# Patient Record
Sex: Male | Born: 1970 | Hispanic: No
Health system: Southern US, Community
[De-identification: ages and names within clinical notes are randomized; demographics above are authoritative.]

## PROBLEM LIST (undated history)

## (undated) HISTORY — PX: KNEE SURGERY: SHX244

---

## 2017-11-27 ENCOUNTER — Ambulatory Visit (INDEPENDENT_AMBULATORY_CARE_PROVIDER_SITE_OTHER): Payer: BLUE CROSS/BLUE SHIELD

## 2017-11-27 ENCOUNTER — Other Ambulatory Visit: Payer: Self-pay

## 2017-11-27 ENCOUNTER — Ambulatory Visit
Admission: EM | Admit: 2017-11-27 | Discharge: 2017-11-27 | Disposition: A | Discharge status: Home / Self Care | Payer: BLUE CROSS/BLUE SHIELD | Attending: Family Medicine | Admitting: Family Medicine

## 2017-11-27 DIAGNOSIS — W108XXA Fall (on) (from) other stairs and steps, initial encounter: Secondary | ICD-10-CM

## 2017-11-27 DIAGNOSIS — S6992XA Unspecified injury of left wrist, hand and finger(s), initial encounter: Secondary | ICD-10-CM | POA: Diagnosis not present

## 2017-11-27 DIAGNOSIS — R2232 Localized swelling, mass and lump, left upper limb: Secondary | ICD-10-CM

## 2017-11-27 DIAGNOSIS — M79645 Pain in left finger(s): Secondary | ICD-10-CM | POA: Diagnosis not present

## 2017-11-27 NOTE — Discharge Instructions (Signed)
Rest, ice.  Ibuprofen as needed.  Take care  Dr. Arbutus Nelligan  

## 2017-11-27 NOTE — ED Triage Notes (Signed)
Pt reports tripping and landing of his hand on Saturday, bending his left thumb back. Thumb is swollen. Pain is minimal. Reports "I'm getting movement back."

## 2017-11-27 NOTE — ED Provider Notes (Signed)
[facility]    CSN: [id number] Arrival date & time: 11/27/17  1029  History   Chief Complaint Chief Complaint  Patient presents with  . Hand Injury   HPI   47 year old male presents with a thumb injury.  Patient states that on Saturday he fell while going up the steps.  When he landed he hyperextended his left thumb.  Patient reports bruising, swelling, and pain at the MCP joint.  No reports of wrist pain.  Worse with activity.  No relieving factors.  He states that the pain is minimal.  He states that he feels like it is improving.  No other reported symptoms.  No other complaints or concerns at this time.  Social History Social History   Tobacco Use  . Smoking status: Former Games developermoker  . Smokeless tobacco: Current User    Types: Snuff  Substance Use Topics  . Alcohol use: Yes    Comment: 4-5 beers daily  . Drug use: Not Currently   Allergies   Patient has no known allergies.  Review of Systems Review of Systems  Constitutional: Negative.   Musculoskeletal:       Thumb injury, swelling, bruising.   Physical Exam Triage Vital Signs ED Triage Vitals  Enc Vitals Group     BP 11/27/17 1043 (!) 184/108     Pulse Rate 11/27/17 1043 71     Resp 11/27/17 1043 18     Temp 11/27/17 1043 98.2 F (36.8 C)     Temp Source 11/27/17 1043 Oral     SpO2 11/27/17 1043 100 %     Weight 11/27/17 1042 186 lb (84.4 kg)     Height 11/27/17 1042 5\' 8"  (1.727 m)     Head Circumference --      Peak Flow --      Pain Score 11/27/17 1041 1     Pain Loc --      Pain Edu? --      Excl. in GC? --    Updated Vital Signs BP (!) 184/108 (BP Location: Left Arm)   Pulse 71   Temp 98.2 F (36.8 C) (Oral)   Resp 18   Ht 5\' 8"  (1.727 m)   Wt 186 lb (84.4 kg)   SpO2 100%   BMI 28.28 kg/m   Physical Exam  Constitutional: He is oriented to person, place, and time. He appears well-developed. No distress.  Cardiovascular: Normal rate and regular rhythm.  Pulmonary/Chest:  Effort normal and breath sounds normal. He has no wheezes. He has no rales.  Musculoskeletal:  Left thumb -mild tenderness at the MCP joint.  Edema and bruising noted.  Neurological: He is alert and oriented to person, place, and time.  Psychiatric: He has a normal mood and affect. His behavior is normal.  Nursing note and vitals reviewed.  UC Treatments / Results  Labs (all labs ordered are listed, but only abnormal results are displayed) Labs Reviewed - No data to display  EKG None  Radiology Dg Finger Thumb Left  Result Date: 11/27/2017 CLINICAL DATA:  Patient reports tripping 3 days ago and hyperextending the thumb during the fall. The patient reports pain and swelling over the first MCP joint. EXAM: LEFT THUMB 2+V COMPARISON:  None in PACs FINDINGS: The bones are subjectively adequately mineralized. There is no acute fracture. The joint spaces are well maintained. There is mild soft tissue swelling over the dorsum of the first MCP joint. IMPRESSION: No acute fracture nor dislocation of the bones  of the left thumb. Electronically Signed   By: David  [place] M.D.   On: 11/27/2017 11:13    Procedures Procedures (including critical care time)  Medications Ordered in UC Medications - No data to display  Initial Impression / Assessment and Plan / UC Course  I have reviewed the triage vital signs and the nursing notes.  Pertinent labs & imaging results that were available during my care of the patient were reviewed by me and considered in my medical decision making (see chart for details).    47 year old male presents with a thumb injury.  X-ray negative.  Rest, ice, ibuprofen.  Supportive care.  Final Clinical Impressions(s) / UC Diagnoses   Final diagnoses:  Injury of left thumb, initial encounter     Discharge Instructions     Rest, ice.  Ibuprofen as needed.  Take care  Dr. Adriana Simas     ED Prescriptions    None     Controlled Substance Prescriptions [place]  Controlled Substance Registry consulted? Not Applicable   Tommie Sams, DO 11/27/17 1140

## 2019-02-28 ENCOUNTER — Ambulatory Visit: Payer: Self-pay

## 2019-03-04 ENCOUNTER — Other Ambulatory Visit: Payer: Self-pay

## 2019-03-04 ENCOUNTER — Ambulatory Visit: Payer: Self-pay

## 2019-03-04 VITALS — BP 142/88 | HR 78 | Resp 15 | Ht 68.0 in | Wt 186.0 lb

## 2019-03-04 DIAGNOSIS — Z008 Encounter for other general examination: Secondary | ICD-10-CM

## 2019-03-04 LAB — POCT LIPID PANEL
HDL: 92
LDL: 82
Non-HDL: 94
POC Glucose: 101 mg/dl — AB (ref 70–99)
TC/HDL: 2
TC: 185
TRG: 61

## 2019-03-04 NOTE — Progress Notes (Signed)
     Patient ID: Stephen Parker, male    DOB: 12/27/1970, 48 y.o.   MRN: [id number]    Thank you!!  [place] [facility] [place]: [phone]  Cell:  [phone] Website: [email or web address]

## 2019-06-05 IMAGING — CR DG FINGER THUMB 2+V*L*
3 series · 3 of 3 positions shown · non-contrast
Comparison: None in PACs

CLINICAL DATA: Patient reports tripping 3 days ago and
hyperextending the thumb during the fall. The patient reports pain
and swelling over the first MCP joint.

EXAM:
LEFT THUMB 2+V

[finger ap]
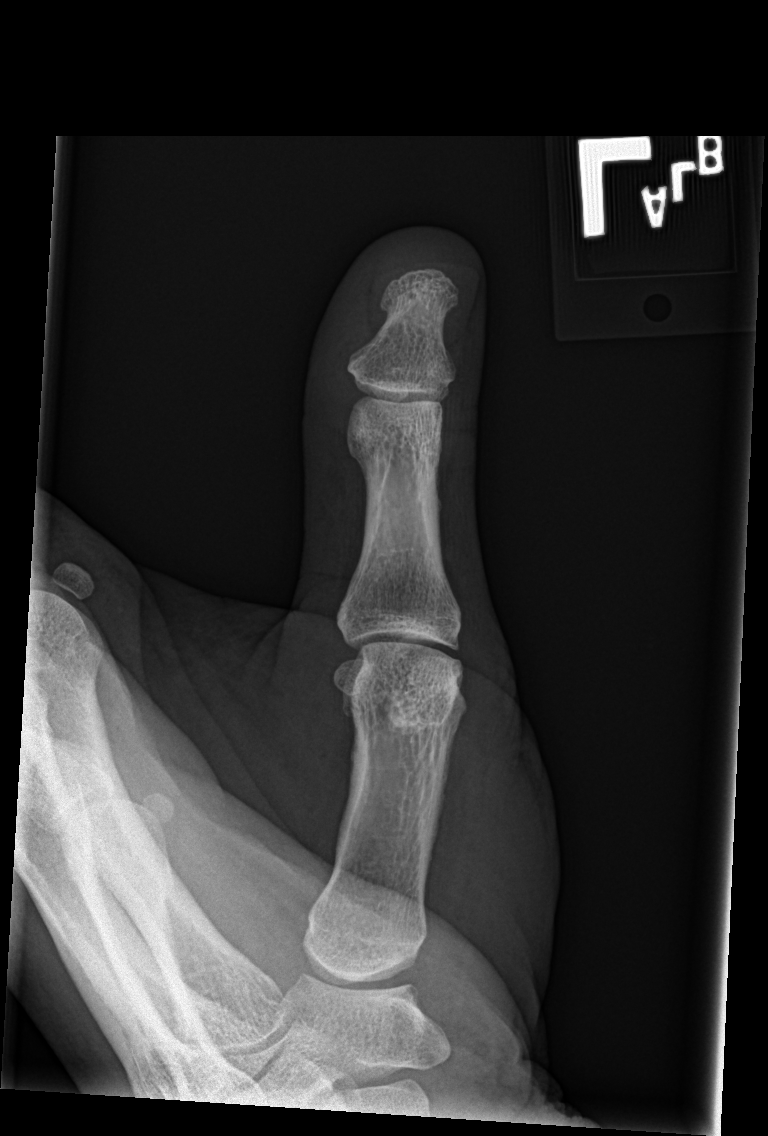

[finger obl]
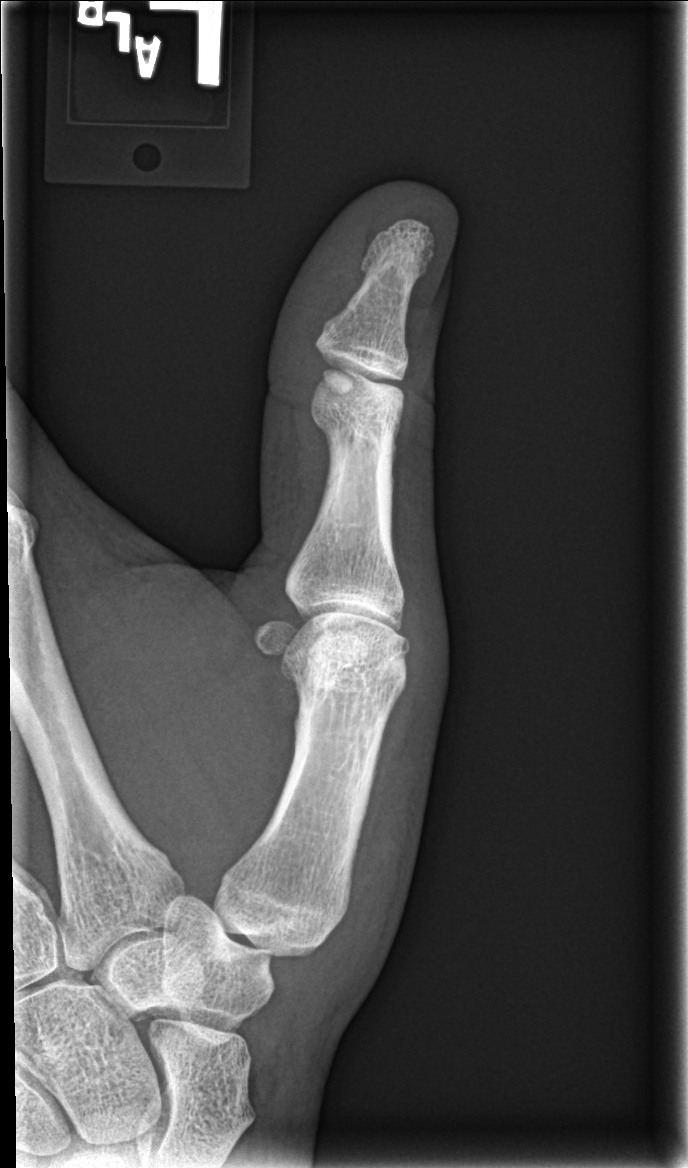

[finger lat]
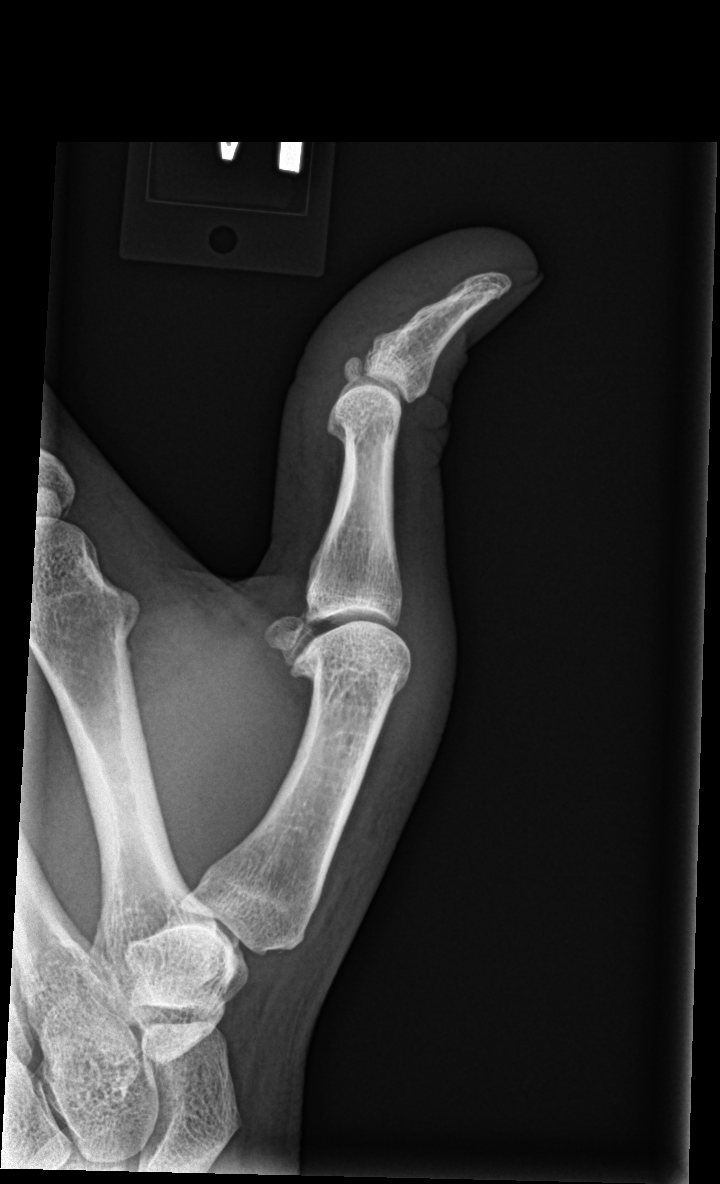

[3 of 3 positions shown; findings below may reference images not displayed]

FINDINGS: The bones are subjectively adequately mineralized. There is no acute
fracture. The joint spaces are well maintained. There is mild soft
tissue swelling over the dorsum of the first MCP joint.
IMPRESSION: No acute fracture nor dislocation of the bones of the left thumb.

## 2019-08-21 ENCOUNTER — Ambulatory Visit: Payer: Self-pay | Attending: Internal Medicine

## 2019-08-21 DIAGNOSIS — Z23 Encounter for immunization: Secondary | ICD-10-CM

## 2019-08-21 NOTE — Progress Notes (Signed)
   Covid-19 Vaccination Clinic  Name:  Machi Whittaker    MRN: [id number] DOB: [birth year]  08/21/2019  Mr. Mullett was observed post Covid-19 immunization for 15 minutes without incident. He was provided with Vaccine Information Sheet and instruction to access the V-Safe system.   Mr. Braxton was instructed to call 911 with any severe reactions post vaccine: Marland Kitchen Difficulty breathing  . Swelling of face and throat  . A fast heartbeat  . A bad rash all over body  . Dizziness and weakness   Immunizations Administered    Name Date Dose VIS Date Route   Pfizer COVID-19 Vaccine 08/21/2019 10:13 AM 0.3 mL 05/16/2019 Intramuscular   Manufacturer: [employer], [employer]   Lot: [id number]   NDC: [address]-4

## 2019-08-25 ENCOUNTER — Ambulatory Visit: Payer: Self-pay

## 2019-09-16 ENCOUNTER — Ambulatory Visit: Payer: Self-pay
